# Patient Record
Sex: Female | Born: 2000
Health system: Southern US, Community
[De-identification: ages and names within clinical notes are randomized; demographics above are authoritative.]

## PROBLEM LIST (undated history)

## (undated) DIAGNOSIS — J45909 Unspecified asthma, uncomplicated: Secondary | ICD-10-CM

---
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
## 2022-02-09 ENCOUNTER — Emergency Department (HOSPITAL_BASED_OUTPATIENT_CLINIC_OR_DEPARTMENT_OTHER): Payer: Medicaid Other

## 2022-02-09 ENCOUNTER — Encounter (HOSPITAL_BASED_OUTPATIENT_CLINIC_OR_DEPARTMENT_OTHER): Payer: Self-pay | Admitting: Emergency Medicine

## 2022-02-09 ENCOUNTER — Other Ambulatory Visit: Payer: Self-pay

## 2022-02-09 ENCOUNTER — Emergency Department (HOSPITAL_BASED_OUTPATIENT_CLINIC_OR_DEPARTMENT_OTHER)
Admission: EM | Admit: 2022-02-09 | Discharge: 2022-02-10 | Disposition: A | Discharge status: Home / Self Care | Payer: Medicaid Other | Attending: Emergency Medicine | Admitting: Emergency Medicine

## 2022-02-09 DIAGNOSIS — R109 Unspecified abdominal pain: Secondary | ICD-10-CM

## 2022-02-09 DIAGNOSIS — R112 Nausea with vomiting, unspecified: Secondary | ICD-10-CM | POA: Diagnosis not present

## 2022-02-09 DIAGNOSIS — R1032 Left lower quadrant pain: Secondary | ICD-10-CM | POA: Diagnosis present

## 2022-02-09 DIAGNOSIS — D72829 Elevated white blood cell count, unspecified: Secondary | ICD-10-CM | POA: Diagnosis not present

## 2022-02-09 LAB — CBC
HCT: 42.1 % (ref 36.0–46.0)
Hemoglobin: 14 g/dL (ref 12.0–15.0)
MCH: 30.9 pg (ref 26.0–34.0)
MCHC: 33.3 g/dL (ref 30.0–36.0)
MCV: 92.9 fL (ref 80.0–100.0)
Platelets: 310 10*3/uL (ref 150–400)
RBC: 4.53 MIL/uL (ref 3.87–5.11)
RDW: 12.3 % (ref 11.5–15.5)
WBC: 15 10*3/uL — ABNORMAL HIGH (ref 4.0–10.5)
nRBC: 0 % (ref 0.0–0.2)

## 2022-02-09 LAB — COMPREHENSIVE METABOLIC PANEL
ALT: 8 U/L (ref 0–44)
AST: 12 U/L — ABNORMAL LOW (ref 15–41)
Albumin: 4.1 g/dL (ref 3.5–5.0)
Alkaline Phosphatase: 45 U/L (ref 38–126)
Anion gap: 10 (ref 5–15)
BUN: 13 mg/dL (ref 6–20)
CO2: 22 mmol/L (ref 22–32)
Calcium: 9.6 mg/dL (ref 8.9–10.3)
Chloride: 105 mmol/L (ref 98–111)
Creatinine, Ser: 0.73 mg/dL (ref 0.44–1.00)
GFR, Estimated: >60 mL/min (ref >60)
Glucose, Bld: 87 mg/dL (ref 70–99)
Potassium: 3.8 mmol/L (ref 3.5–5.1)
Sodium: 137 mmol/L (ref 135–145)
Total Bilirubin: 0.2 mg/dL — ABNORMAL LOW (ref 0.3–1.2)
Total Protein: 7.2 g/dL (ref 6.5–8.1)

## 2022-02-09 LAB — URINALYSIS, ROUTINE W REFLEX MICROSCOPIC
Bilirubin Urine: NEGATIVE
Glucose, UA: NEGATIVE mg/dL
Hgb urine dipstick: NEGATIVE
Ketones, ur: 15 mg/dL — AB
Nitrite: NEGATIVE
Specific Gravity, Urine: 1.032 — ABNORMAL HIGH (ref 1.005–1.030)
pH: 7 (ref 5.0–8.0)

## 2022-02-09 LAB — LIPASE, BLOOD: Lipase: 20 U/L (ref 11–51)

## 2022-02-09 LAB — PREGNANCY, URINE: Preg Test, Ur: NEGATIVE

## 2022-02-09 MED ORDER — IOHEXOL 300 MG/ML  SOLN
100.0000 mL | Freq: Once | INTRAMUSCULAR | Status: AC | PRN
  Administered 2022-02-09: 100 mL via INTRAVENOUS

## 2022-02-09 NOTE — ED Notes (Signed)
BIBA, pt sts that she was at graduation, became nauseous and diaphoretic, pt ate chipotle and began feeling sick.

## 2022-02-09 NOTE — ED Triage Notes (Signed)
Pt via pov from home with abdominal pain since this afternoon; pt had one episode of emesis, and has continuing acid reflux. Pt denies any hx of the same; states her pain is generalized around the middle of her abdomen. Denies urinary symptoms; states she was 30 minutes out from a meal at the time the pain started. Pt alert & oriented, nad noted.

## 2022-02-09 NOTE — ED Notes (Signed)
Pt still unable to provide urine sample 

## 2022-02-10 ENCOUNTER — Encounter (HOSPITAL_BASED_OUTPATIENT_CLINIC_OR_DEPARTMENT_OTHER): Payer: Self-pay | Admitting: Emergency Medicine

## 2022-02-10 MED ORDER — DICYCLOMINE HCL 20 MG PO TABS: 20.0000 mg | ORAL_TABLET | Freq: Two times a day (BID) | ORAL | 0 refills | Status: AC

## 2022-02-10 MED ORDER — KETOROLAC TROMETHAMINE 30 MG/ML IJ SOLN
30.0000 mg | Freq: Once | INTRAMUSCULAR | Status: AC
  Administered 2022-02-10: 30 mg via INTRAVENOUS
  Filled 2022-02-10: qty 1

## 2022-02-10 MED ORDER — FOSFOMYCIN TROMETHAMINE 3 G PO PACK
3.0000 g | PACK | Freq: Once | ORAL | Status: AC
  Administered 2022-02-10: 3 g via ORAL
  Filled 2022-02-10: qty 3

## 2022-02-10 NOTE — ED Provider Notes (Signed)
[facility] Provider Note   CSN: [id number] Arrival date & time: 02/09/22  2030     History  Chief Complaint  Patient presents with   Abdominal Pain    Julia Munoz is a 21 y.o. female.  The history is provided by the patient.  Abdominal Pain Pain location:  LLQ and RLQ Pain quality: aching   Pain radiates to:  Does not radiate Pain severity:  Moderate Onset quality:  Gradual Duration:  1 day Timing:  Constant Progression:  Unchanged Chronicity:  New Relieved by:  Nothing Worsened by:  Nothing Ineffective treatments:  None tried Associated symptoms: nausea and vomiting   Associated symptoms: no diarrhea, no dysuria, no fever, no flatus and no vaginal discharge   Risk factors: no alcohol abuse        Home Medications Prior to Admission medications   Medication Sig Start Date End Date Taking? Authorizing Provider  albuterol (VENTOLIN HFA) 108 (90 Base) MCG/ACT inhaler Inhale 1-2 puffs into the lungs every 6 (six) hours as needed for wheezing or shortness of breath. 12/07/21   Maleke Feria, MD  beclomethasone (QVAR REDIHALER) 40 MCG/ACT inhaler Inhale 2 puffs into the lungs 2 (two) times daily. 12/07/21   Lativia Velie, MD  beclomethasone (QVAR) 80 MCG/ACT inhaler Inhale 2 puffs into the lungs 2 (two) times daily.  07/10/12   Cioffredi, Eulis Canner, MD  cetirizine (ZYRTEC ALLERGY) 10 MG tablet Take 1 tablet (10 mg total) by mouth daily. 12/07/21   Tenee Wish, MD  cetirizine (ZYRTEC) 10 MG tablet Take 10 mg by mouth every evening.    [provider]  hydrOXYzine (ATARAX/VISTARIL) 25 MG tablet Take 1 tablet (25 mg total) by mouth every 6 (six) hours as needed. Patient not taking: No sig reported 07/04/17   McDonald, Mia A, PA-C  LARIN FE 1.5/30 1.5-30 MG-MCG tablet Take 1 tablet by mouth daily. 01/28/21   [provider]  montelukast (SINGULAIR) 5 MG chewable tablet Chew 5 mg by mouth at bedtime.    [provider]   ondansetron (ZOFRAN ODT) 4 MG disintegrating tablet Take 1 tablet (4 mg total) by mouth every 8 (eight) hours as needed. Patient not taking: No sig reported 10/07/17   Jean Rosenthal, NP  polyethylene glycol (MIRALAX) packet Take 17 g by mouth daily. Patient not taking: No sig reported 03/21/18   Pixie Casino, MD  predniSONE (DELTASONE) 10 MG tablet Take 2 tablets (20 mg total) by mouth daily. 01/29/21   Pattricia Boss, MD  predniSONE (DELTASONE) 20 MG tablet 3 tabs po day one, then 2 po daily x 4 days 12/07/21   Juni Glaab, MD  triamcinolone (NASACORT) 55 MCG/ACT AERO nasal inhaler Place 2 sprays into the nose daily. 01/29/21   Pattricia Boss, MD      Allergies    Penicillins    Review of Systems   Review of Systems  Constitutional:  Negative for fever.  HENT:  Negative for facial swelling.   Eyes:  Negative for redness.  Respiratory:  Negative for wheezing and stridor.   Cardiovascular:  Negative for leg swelling.  Gastrointestinal:  Positive for abdominal pain, nausea and vomiting. Negative for diarrhea and flatus.  Genitourinary:  Negative for dysuria and vaginal discharge.  Musculoskeletal:  Negative for back pain.  Neurological:  Negative for facial asymmetry.  All other systems reviewed and are negative.   Physical Exam Updated Vital Signs BP 113/63 (BP Location: Right Arm)   Pulse 83   Temp 98.2  F (36.8 C) (Oral)   Resp 20   Ht 5\' 5"  (1.651 m)   Wt 79.4 kg   LMP 01/28/2022 (Approximate)   SpO2 100%   BMI 29.12 kg/m  Physical Exam Vitals and nursing note reviewed.  Constitutional:      General: She is not in acute distress.    Appearance: She is well-developed. She is not diaphoretic.  HENT:     Head: Normocephalic and atraumatic.     Nose: Nose normal.     Mouth/Throat:     Mouth: Mucous membranes are moist.  Eyes:     Conjunctiva/sclera: Conjunctivae normal.     Pupils: Pupils are equal, round, and reactive to light.     Comments: Normal appearance   Cardiovascular:     Rate and Rhythm: Normal rate and regular rhythm.     Pulses: Normal pulses.     Heart sounds: Normal heart sounds.  Pulmonary:     Effort: Pulmonary effort is normal. No respiratory distress.     Breath sounds: Normal breath sounds.  Abdominal:     General: Abdomen is flat. Bowel sounds are normal. There is no distension.     Palpations: Abdomen is soft. There is no mass.     Tenderness: There is no abdominal tenderness. There is no guarding or rebound.  Genitourinary:    Comments: No CVA tenderness Musculoskeletal:        General: Normal range of motion.     Cervical back: Normal range of motion and neck supple.  Skin:    General: Skin is warm and dry.     Capillary Refill: Capillary refill takes less than 2 seconds.     Findings: No rash.  Neurological:     General: No focal deficit present.     Mental Status: She is alert and oriented to person, place, and time.  Psychiatric:        Mood and Affect: Mood normal.        Behavior: Behavior normal.     ED Results / Procedures / Treatments   Labs (all labs ordered are listed, but only abnormal results are displayed) Results for orders placed or performed during the hospital encounter of 02/09/22  Lipase, blood  Result Value Ref Range   Lipase 20 11 - 51 U/L  Comprehensive metabolic panel  Result Value Ref Range   Sodium 137 135 - 145 mmol/L   Potassium 3.8 3.5 - 5.1 mmol/L   Chloride 105 98 - 111 mmol/L   CO2 22 22 - 32 mmol/L   Glucose, Bld 87 70 - 99 mg/dL   BUN 13 6 - 20 mg/dL   Creatinine, Ser 0.73 0.44 - 1.00 mg/dL   Calcium 9.6 8.9 - 10.3 mg/dL   Total Protein 7.2 6.5 - 8.1 g/dL   Albumin 4.1 3.5 - 5.0 g/dL   AST 12 (L) 15 - 41 U/L   ALT 8 0 - 44 U/L   Alkaline Phosphatase 45 38 - 126 U/L   Total Bilirubin 0.2 (L) 0.3 - 1.2 mg/dL   GFR, Estimated >60 >60 mL/min   Anion gap 10 5 - 15  CBC  Result Value Ref Range   WBC 15.0 (H) 4.0 - 10.5 K/uL   RBC 4.53 3.87 - 5.11 MIL/uL    Hemoglobin 14.0 12.0 - 15.0 g/dL   HCT 42.1 36.0 - 46.0 %   MCV 92.9 80.0 - 100.0 fL   MCH 30.9 26.0 - 34.0 pg   MCHC 33.3  30.0 - 36.0 g/dL   RDW 12.3 11.5 - 15.5 %   Platelets 310 150 - 400 K/uL   nRBC 0.0 0.0 - 0.2 %  Pregnancy, urine  Result Value Ref Range   Preg Test, Ur NEGATIVE NEGATIVE  Urinalysis, Routine w reflex microscopic Urine, Unspecified Source  Result Value Ref Range   Color, Urine YELLOW YELLOW   APPearance CLEAR CLEAR   Specific Gravity, Urine 1.032 (H) 1.005 - 1.030   pH 7.0 5.0 - 8.0   Glucose, UA NEGATIVE NEGATIVE mg/dL   Hgb urine dipstick NEGATIVE NEGATIVE   Bilirubin Urine NEGATIVE NEGATIVE   Ketones, ur 15 (A) NEGATIVE mg/dL   Protein, ur TRACE (A) NEGATIVE mg/dL   Nitrite NEGATIVE NEGATIVE   Leukocytes,Ua SMALL (A) NEGATIVE   RBC / HPF 0-5 0 - 5 RBC/hpf   WBC, UA 0-5 0 - 5 WBC/hpf   Bacteria, UA RARE (A) NONE SEEN   Squamous Epithelial / LPF 0-5 0 - 5   Mucus PRESENT    CT ABDOMEN PELVIS W CONTRAST  Result Date: 02/10/2022 CLINICAL DATA:  Abdominal pain, acute, nonlocalized EXAM: CT ABDOMEN AND PELVIS WITH CONTRAST TECHNIQUE: Multidetector CT imaging of the abdomen and pelvis was performed using the standard protocol following bolus administration of intravenous contrast. RADIATION DOSE REDUCTION: This exam was performed according to the departmental dose-optimization program which includes automated exposure control, adjustment of the mA and/or kV according to patient size and/or use of iterative reconstruction technique. CONTRAST:  181mL OMNIPAQUE IOHEXOL 300 MG/ML  SOLN COMPARISON:  None Available. FINDINGS: Lower chest: No acute findings Hepatobiliary: No focal hepatic abnormality. Gallbladder unremarkable. Pancreas: No focal abnormality or ductal dilatation. Spleen: No focal abnormality.  Normal size. Adrenals/Urinary Tract: No adrenal abnormality. No focal renal abnormality. No stones or hydronephrosis. Urinary bladder is unremarkable. Stomach/Bowel:  Normal appendix. Stomach, large and small bowel grossly unremarkable. Vascular/Lymphatic: No evidence of aneurysm or adenopathy. Reproductive: Uterus and adnexa unremarkable.  No mass. Other: No free fluid or free air. Musculoskeletal: No acute bony abnormality. IMPRESSION: No acute findings in the abdomen or pelvis. Electronically Signed   By: Rolm Baptise M.D.   On: 02/10/2022 00:11     Radiology CT ABDOMEN PELVIS W CONTRAST  Result Date: 02/10/2022 CLINICAL DATA:  Abdominal pain, acute, nonlocalized EXAM: CT ABDOMEN AND PELVIS WITH CONTRAST TECHNIQUE: Multidetector CT imaging of the abdomen and pelvis was performed using the standard protocol following bolus administration of intravenous contrast. RADIATION DOSE REDUCTION: This exam was performed according to the departmental dose-optimization program which includes automated exposure control, adjustment of the mA and/or kV according to patient size and/or use of iterative reconstruction technique. CONTRAST:  149mL OMNIPAQUE IOHEXOL 300 MG/ML  SOLN COMPARISON:  None Available. FINDINGS: Lower chest: No acute findings Hepatobiliary: No focal hepatic abnormality. Gallbladder unremarkable. Pancreas: No focal abnormality or ductal dilatation. Spleen: No focal abnormality.  Normal size. Adrenals/Urinary Tract: No adrenal abnormality. No focal renal abnormality. No stones or hydronephrosis. Urinary bladder is unremarkable. Stomach/Bowel: Normal appendix. Stomach, large and small bowel grossly unremarkable. Vascular/Lymphatic: No evidence of aneurysm or adenopathy. Reproductive: Uterus and adnexa unremarkable.  No mass. Other: No free fluid or free air. Musculoskeletal: No acute bony abnormality. IMPRESSION: No acute findings in the abdomen or pelvis. Electronically Signed   By: Rolm Baptise M.D.   On: 02/10/2022 00:11    Procedures Procedures    Medications Ordered in ED Medications  iohexol (OMNIPAQUE) 300 MG/ML solution 100 mL (100 mLs Intravenous  Contrast Given 02/09/22  2345)  fosfomycin (MONUROL) packet 3 g (3 g Oral Given 02/10/22 0023)  ketorolac (TORADOL) 30 MG/ML injection 30 mg (30 mg Intravenous Given 02/10/22 0024)    ED Course/ Medical Decision Making/ A&P                           Medical Decision Making Nausea and vomiting and pain at a graduation party.    Amount and/or Complexity of Data Reviewed Independent Historian: parent    Details: see above External Data Reviewed: notes.    Details: previous notes reviewed Labs: ordered.    Details: all labs reviewed: urine with mild uti.  White count slight elevation at 15, normal hemoglobin and platelets. Lipase is normal 20 normal sodium 137 and potassium 3.8 and creatinine .44 Radiology: ordered and independent interpretation performed.    Details: CT negative by my inspection  Risk Prescription drug management. Risk Details: Well appearing with normal exam and CT scan.  Po challenged successfully.  Have treated for UTI in the ED although symptoms likely viral.  Strict return precautions given.      Final Clinical Impression(s) / ED Diagnoses Final diagnoses:  None   Return for intractable cough, coughing up blood, fevers > 100.4 unrelieved by medication, shortness of breath, intractable vomiting, chest pain, shortness of breath, weakness, numbness, changes in speech, facial asymmetry, abdominal pain, passing out, Inability to tolerate liquids or food, cough, altered mental status or any concerns. No signs of systemic illness or infection. The patient is nontoxic-appearing on exam and vital signs are within normal limits.  I have reviewed the triage vital signs and the nursing notes. Pertinent labs & imaging results that were available during my care of the patient were reviewed by me and considered in my medical decision making (see chart for details). After history, exam, and medical workup I feel the patient has been appropriately medically screened and is safe for  discharge home. Pertinent diagnoses were discussed with the patient. Patient was given return precautions Rx / DC Orders ED Discharge Orders     None         Jessejames Steelman, MD 02/10/22 [id number]

## 2023-10-13 ENCOUNTER — Emergency Department (HOSPITAL_COMMUNITY)
Admission: EM | Admit: 2023-10-13 | Discharge: 2023-10-13 | Disposition: A | Discharge status: Home / Self Care | Payer: 59 | Attending: Emergency Medicine | Admitting: Emergency Medicine

## 2023-10-13 ENCOUNTER — Encounter (HOSPITAL_COMMUNITY): Payer: Self-pay | Admitting: Emergency Medicine

## 2023-10-13 ENCOUNTER — Emergency Department (HOSPITAL_COMMUNITY): Payer: 59

## 2023-10-13 ENCOUNTER — Other Ambulatory Visit: Payer: Self-pay

## 2023-10-13 DIAGNOSIS — S301XXA Contusion of abdominal wall, initial encounter: Secondary | ICD-10-CM | POA: Diagnosis not present

## 2023-10-13 DIAGNOSIS — Y92410 Unspecified street and highway as the place of occurrence of the external cause: Secondary | ICD-10-CM | POA: Insufficient documentation

## 2023-10-13 DIAGNOSIS — S3991XA Unspecified injury of abdomen, initial encounter: Secondary | ICD-10-CM | POA: Diagnosis present

## 2023-10-13 DIAGNOSIS — S00212A Abrasion of left eyelid and periocular area, initial encounter: Secondary | ICD-10-CM | POA: Insufficient documentation

## 2023-10-13 LAB — BASIC METABOLIC PANEL
Anion gap: 13 (ref 5–15)
BUN: 7 mg/dL (ref 6–20)
CO2: 19 mmol/L — ABNORMAL LOW (ref 22–32)
Calcium: 9.4 mg/dL (ref 8.9–10.3)
Chloride: 104 mmol/L (ref 98–111)
Creatinine, Ser: 0.78 mg/dL (ref 0.44–1.00)
GFR, Estimated: >60 mL/min (ref >60)
Glucose, Bld: 125 mg/dL — ABNORMAL HIGH (ref 70–99)
Potassium: 3.7 mmol/L (ref 3.5–5.1)
Sodium: 136 mmol/L (ref 135–145)

## 2023-10-13 LAB — CBC
HCT: 45.1 % (ref 36.0–46.0)
Hemoglobin: 15.2 g/dL — ABNORMAL HIGH (ref 12.0–15.0)
MCH: 30.5 pg (ref 26.0–34.0)
MCHC: 33.7 g/dL (ref 30.0–36.0)
MCV: 90.6 fL (ref 80.0–100.0)
Platelets: 409 10*3/uL — ABNORMAL HIGH (ref 150–400)
RBC: 4.98 MIL/uL (ref 3.87–5.11)
RDW: 11.8 % (ref 11.5–15.5)
WBC: 13.2 10*3/uL — ABNORMAL HIGH (ref 4.0–10.5)
nRBC: 0 % (ref 0.0–0.2)

## 2023-10-13 LAB — HCG, QUANTITATIVE, PREGNANCY: hCG, Beta Chain, Quant, S: <1 m[IU]/mL (ref <5)

## 2023-10-13 MED ORDER — MORPHINE SULFATE (PF) 4 MG/ML IV SOLN
4.0000 mg | Freq: Once | INTRAVENOUS | Status: AC
  Administered 2023-10-13: 4 mg via INTRAVENOUS
  Filled 2023-10-13: qty 1

## 2023-10-13 MED ORDER — SODIUM CHLORIDE 0.9 % IV SOLN: 250.0000 mL | INTRAVENOUS | Status: DC | PRN

## 2023-10-13 MED ORDER — HYDROCODONE-ACETAMINOPHEN 5-325 MG PO TABS
1.0000 | ORAL_TABLET | Freq: Once | ORAL | Status: AC
  Administered 2023-10-13: 1 via ORAL
  Filled 2023-10-13: qty 1

## 2023-10-13 MED ORDER — NAPROXEN 375 MG PO TABS
375.0000 mg | ORAL_TABLET | Freq: Two times a day (BID) | ORAL | 0 refills | Status: AC
Start: 2023-10-13 — End: ?

## 2023-10-13 MED ORDER — IOHEXOL 350 MG/ML SOLN
75.0000 mL | Freq: Once | INTRAVENOUS | Status: AC | PRN
  Administered 2023-10-13: 75 mL via INTRAVENOUS

## 2023-10-13 MED ORDER — ONDANSETRON HCL 4 MG/2ML IJ SOLN
4.0000 mg | Freq: Once | INTRAMUSCULAR | Status: AC
  Administered 2023-10-13: 4 mg via INTRAVENOUS
  Filled 2023-10-13: qty 2

## 2023-10-13 MED ORDER — FENTANYL CITRATE PF 50 MCG/ML IJ SOSY
50.0000 ug | PREFILLED_SYRINGE | Freq: Once | INTRAMUSCULAR | Status: AC
  Administered 2023-10-13: 50 ug via INTRAVENOUS
  Filled 2023-10-13: qty 1

## 2023-10-13 MED ORDER — KETOROLAC TROMETHAMINE 30 MG/ML IJ SOLN
30.0000 mg | Freq: Once | INTRAMUSCULAR | Status: AC
  Administered 2023-10-13: 30 mg via INTRAVENOUS
  Filled 2023-10-13: qty 1

## 2023-10-13 MED ORDER — HYDROCODONE-ACETAMINOPHEN 5-325 MG PO TABS: 1.0000 | ORAL_TABLET | Freq: Four times a day (QID) | ORAL | 0 refills | Status: AC | PRN

## 2023-10-13 MED ORDER — SODIUM CHLORIDE 0.9% FLUSH: 3.0000 mL | INTRAVENOUS | Status: DC | PRN

## 2023-10-13 MED ORDER — SODIUM CHLORIDE 0.9% FLUSH: 3.0000 mL | Freq: Two times a day (BID) | INTRAVENOUS | Status: DC
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
## 2023-10-13 NOTE — Discharge Instructions (Addendum)
Your CAT scan showed that you have a bruise to your abdominal wall.  There are no internal injuries fortunately.  Apply ice to help with the swelling and bruising.  Expect to be stiff and sore for the next several days.  Symptoms should slowly improve over the next week

## 2023-10-13 NOTE — ED Notes (Signed)
Patient verbalizes understanding of discharge instructions. Opportunity for questioning and answers were provided. Armband removed by staff, pt discharged from ED. Pt taken to ED waiting room via wheel chair.

## 2023-10-13 NOTE — ED Notes (Signed)
CCMD contacted for cardiac monitoring

## 2024-06-10 IMAGING — CT CT ABD-PELV W/ CM
2 of 4 series · 16 of 46 positions shown, 18 images · IV contrast (APPLIED)
Comparison: None Available.

CLINICAL DATA: Abdominal pain, acute, nonlocalized

EXAM:
CT ABDOMEN AND PELVIS WITH CONTRAST
TECHNIQUE: Multidetector CT imaging of the abdomen and pelvis was performed
using the standard protocol following bolus administration of
intravenous contrast.

[Series 2: abd pel w · axial · 0.62mm/px · z∈[+834,+1249]mm · 13 of 91 slices shown, 15 images]
[im 4/91  soft-tissue]
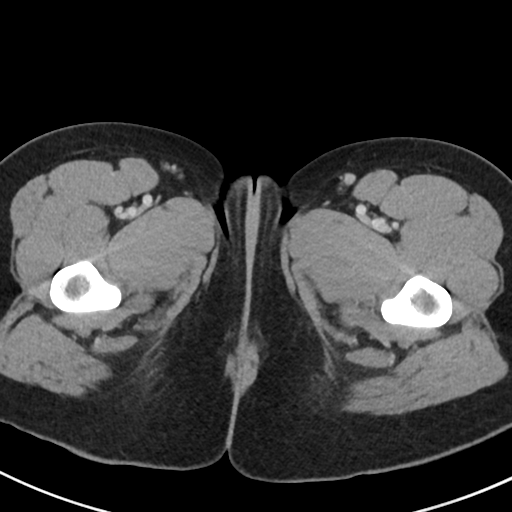
[im 4/91  bone]
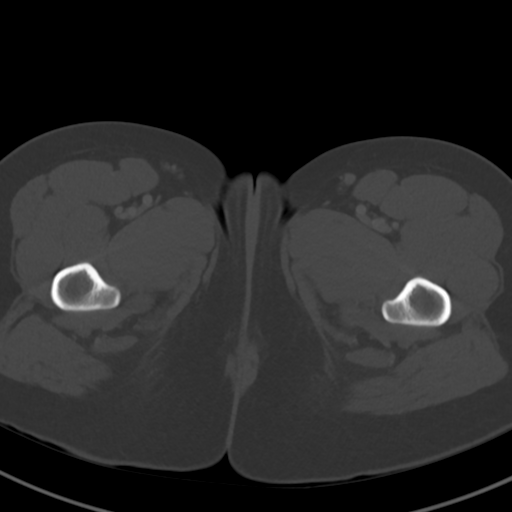
[im 11/91  soft-tissue]
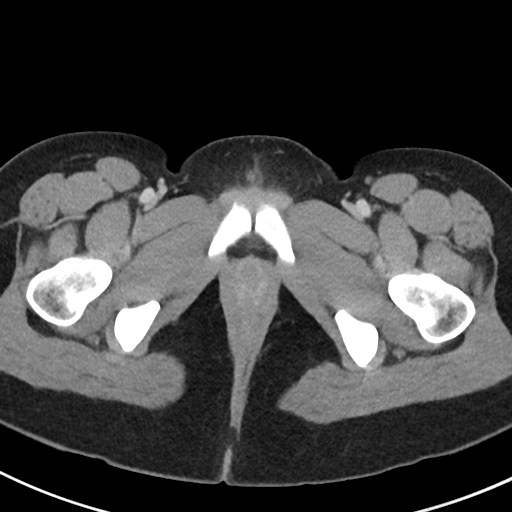
[im 18/91  soft-tissue]
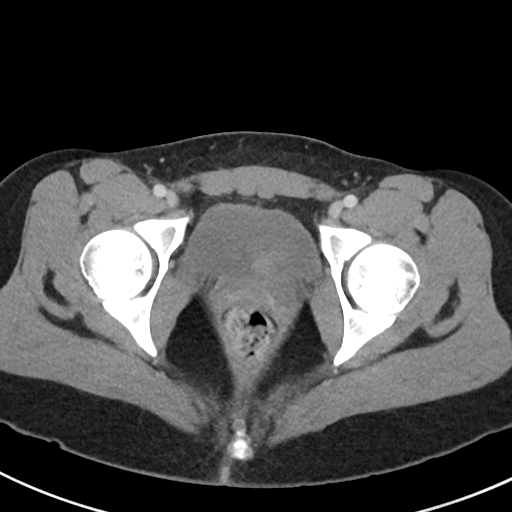
[im 25/91  soft-tissue]
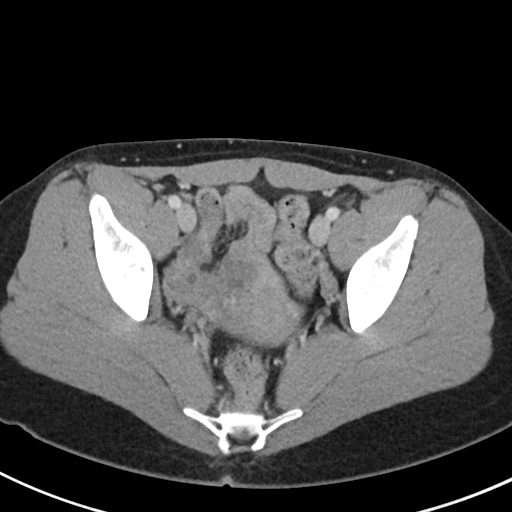
[im 32/91  soft-tissue]
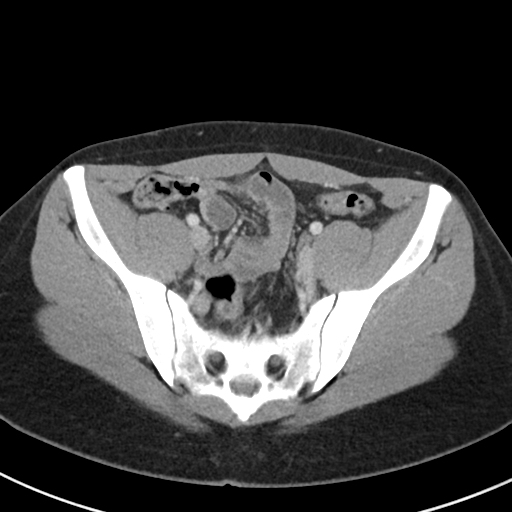
[im 39/91  soft-tissue]
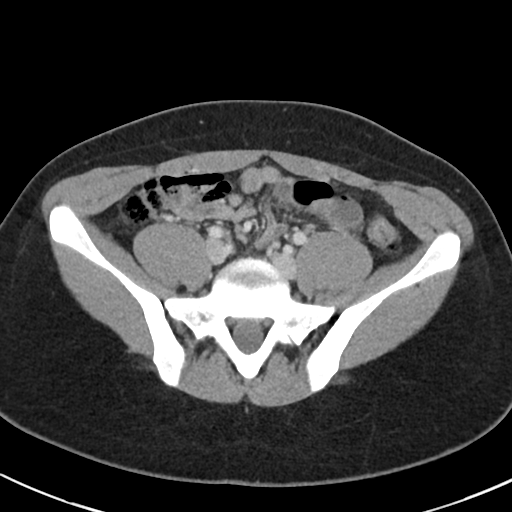
[im 46/91  soft-tissue]
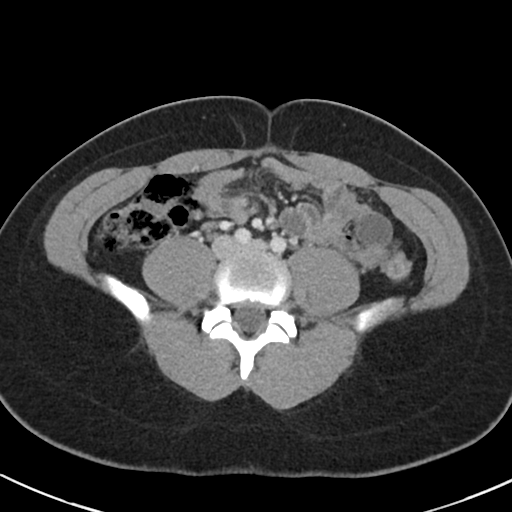
[im 52/91  soft-tissue]
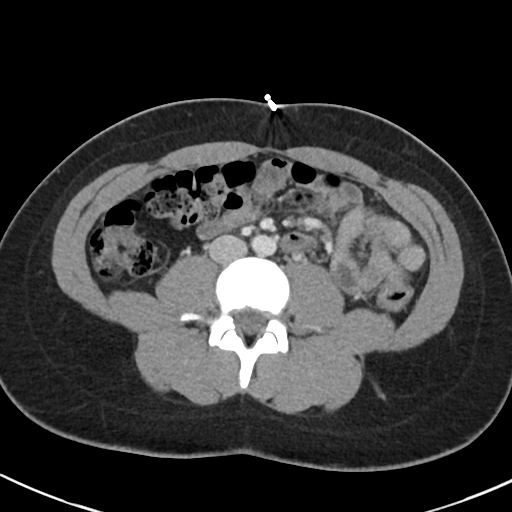
[im 59/91  soft-tissue]
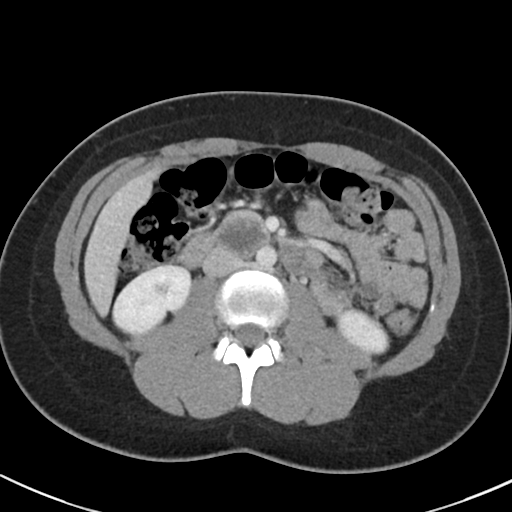
[im 59/91  bone]
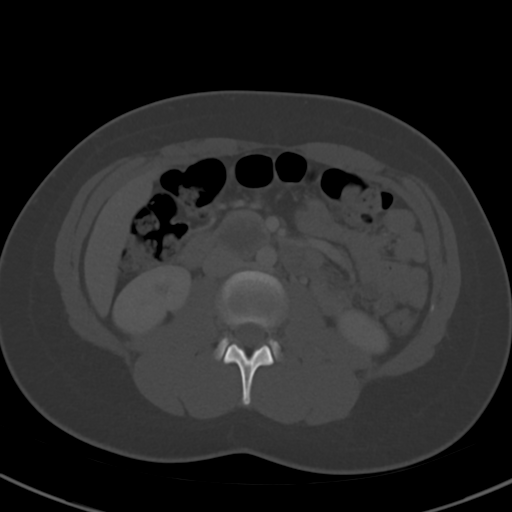
[im 66/91  soft-tissue]
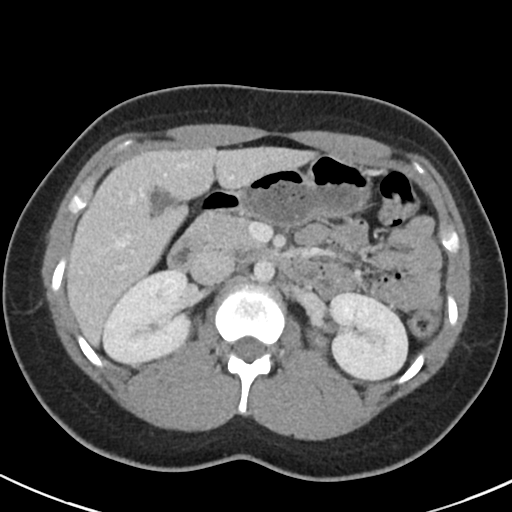
[im 73/91  soft-tissue]
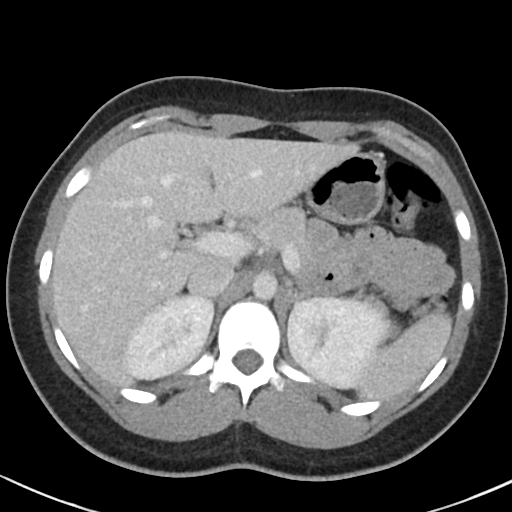
[im 80/91  soft-tissue]
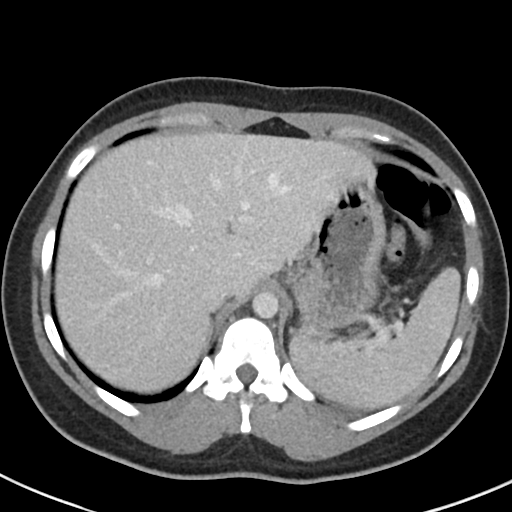
[im 87/91  soft-tissue]
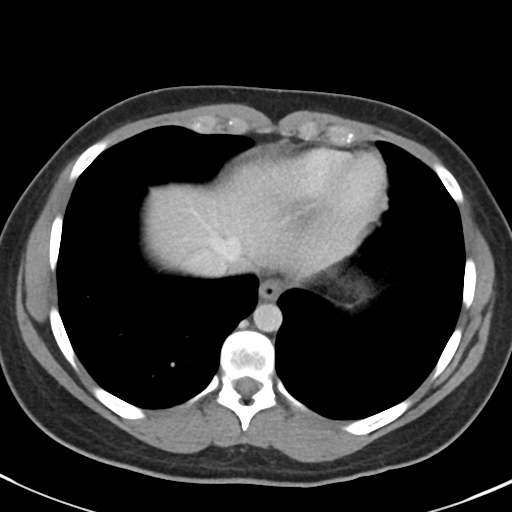

[Series 5: coronal · coronal · 0.64mm/px · 3 of 80 slices shown]
[im 27/80  soft-tissue]
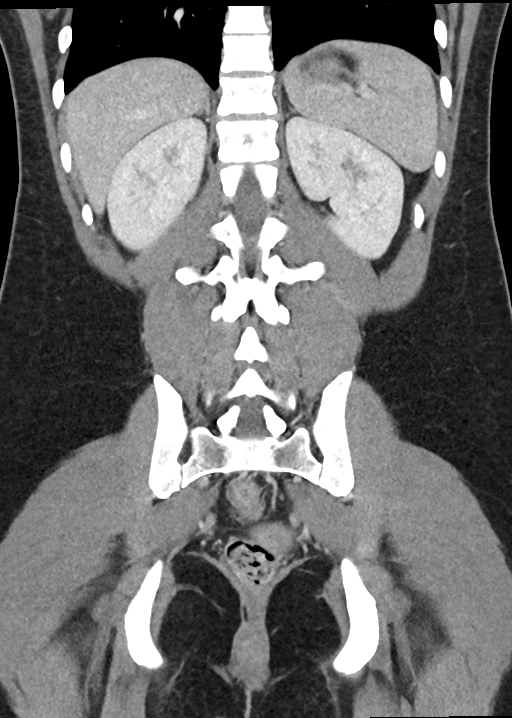
[im 36/80  soft-tissue]
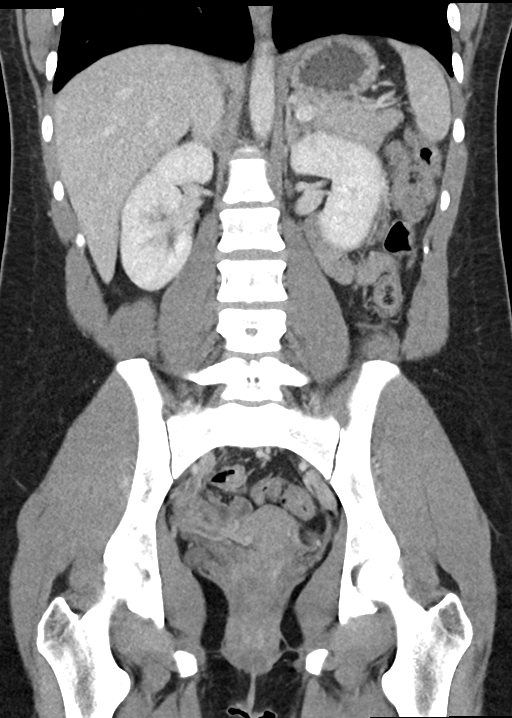
[im 44/80  soft-tissue]
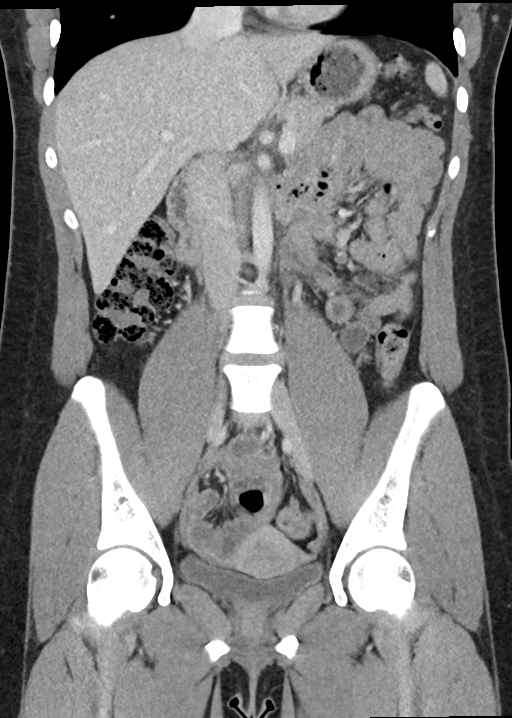

[16 of 46 positions shown; findings below may reference images not displayed]

RADIATION DOSE REDUCTION: This exam was performed according to the
departmental dose-optimization program which includes automated
exposure control, adjustment of the mA and/or kV according to
patient size and/or use of iterative reconstruction technique.

CONTRAST:  100mL OMNIPAQUE IOHEXOL 300 MG/ML  SOLN
FINDINGS: Lower chest: No acute findings

Hepatobiliary: No focal hepatic abnormality. Gallbladder
unremarkable.

Pancreas: No focal abnormality or ductal dilatation.

Spleen: No focal abnormality.  Normal size.

Adrenals/Urinary Tract: No adrenal abnormality. No focal renal
abnormality. No stones or hydronephrosis. Urinary bladder is
unremarkable.

Stomach/Bowel: Normal appendix. Stomach, large and small bowel
grossly unremarkable.

Vascular/Lymphatic: No evidence of aneurysm or adenopathy.

Reproductive: Uterus and adnexa unremarkable.  No mass.

Other: No free fluid or free air.

Musculoskeletal: No acute bony abnormality.
IMPRESSION: No acute findings in the abdomen or pelvis.
